# Patient Record
Sex: Male | Born: 1980 | Race: Black or African American | Hispanic: No
Health system: Southern US, Community
[De-identification: ages and names within clinical notes are randomized; demographics above are authoritative.]

## PROBLEM LIST (undated history)

## (undated) DIAGNOSIS — E119 Type 2 diabetes mellitus without complications: Secondary | ICD-10-CM

## (undated) DIAGNOSIS — E1143 Type 2 diabetes mellitus with diabetic autonomic (poly)neuropathy: Secondary | ICD-10-CM

## (undated) DIAGNOSIS — K3184 Gastroparesis: Secondary | ICD-10-CM

---

## 2012-01-25 ENCOUNTER — Emergency Department: Payer: Self-pay | Admitting: Emergency Medicine

## 2012-01-25 LAB — BASIC METABOLIC PANEL
BUN: 14 mg/dL (ref 7–18)
Chloride: 94 mmol/L — ABNORMAL LOW (ref 98–107)
Creatinine: 1.24 mg/dL (ref 0.60–1.30)
EGFR (African American): >60
EGFR (Non-African Amer.): >60
Glucose: 467 mg/dL — ABNORMAL HIGH (ref 65–99)
Osmolality: 284 (ref 275–301)
Potassium: 4.7 mmol/L (ref 3.5–5.1)
Sodium: 131 mmol/L — ABNORMAL LOW (ref 136–145)

## 2012-01-25 LAB — URINALYSIS, COMPLETE
Bacteria: NONE SEEN
Glucose,UR: >=500 mg/dL (ref 0–75)
Nitrite: NEGATIVE
RBC,UR: 1 /HPF (ref 0–5)
Specific Gravity: 1.026 (ref 1.003–1.030)

## 2012-01-25 LAB — CBC
HGB: 16.4 g/dL (ref 13.0–18.0)
MCH: 32.1 pg (ref 26.0–34.0)
MCHC: 33.4 g/dL (ref 32.0–36.0)
MCV: 96 fL (ref 80–100)
WBC: 7.5 10*3/uL (ref 3.8–10.6)

## 2012-03-04 ENCOUNTER — Emergency Department: Payer: Self-pay | Admitting: Emergency Medicine

## 2012-03-25 ENCOUNTER — Emergency Department: Payer: Self-pay | Admitting: Emergency Medicine

## 2012-03-25 LAB — LIPASE, BLOOD: Lipase: 71 U/L — ABNORMAL LOW (ref 73–393)

## 2012-03-25 LAB — CBC
HGB: 16.2 g/dL (ref 13.0–18.0)
RBC: 5.1 10*6/uL (ref 4.40–5.90)
WBC: 7 10*3/uL (ref 3.8–10.6)

## 2012-03-25 LAB — COMPREHENSIVE METABOLIC PANEL
Albumin: 4.3 g/dL (ref 3.4–5.0)
Alkaline Phosphatase: 90 U/L (ref 50–136)
BUN: 14 mg/dL (ref 7–18)
Calcium, Total: 9 mg/dL (ref 8.5–10.1)
Glucose: 208 mg/dL — ABNORMAL HIGH (ref 65–99)
Osmolality: 280 (ref 275–301)
SGOT(AST): 24 U/L (ref 15–37)
Sodium: 137 mmol/L (ref 136–145)
Total Protein: 8.1 g/dL (ref 6.4–8.2)

## 2012-03-25 LAB — URINALYSIS, COMPLETE
Bilirubin,UR: NEGATIVE
Blood: NEGATIVE
Nitrite: NEGATIVE
Ph: 7 (ref 4.5–8.0)
RBC,UR: 2 /HPF (ref 0–5)

## 2012-06-13 ENCOUNTER — Emergency Department: Payer: Self-pay | Admitting: Emergency Medicine

## 2012-06-13 LAB — URINALYSIS, COMPLETE
Blood: NEGATIVE
Glucose,UR: 50 mg/dL (ref 0–75)
Hyaline Cast: 1
Leukocyte Esterase: NEGATIVE
Nitrite: NEGATIVE
Protein: 30
Specific Gravity: 1.03 (ref 1.003–1.030)

## 2012-06-13 LAB — COMPREHENSIVE METABOLIC PANEL
Alkaline Phosphatase: 97 U/L (ref 50–136)
BUN: 11 mg/dL (ref 7–18)
Bilirubin,Total: 0.6 mg/dL (ref 0.2–1.0)
Calcium, Total: 9 mg/dL (ref 8.5–10.1)
Chloride: 104 mmol/L (ref 98–107)
Co2: 30 mmol/L (ref 21–32)
EGFR (Non-African Amer.): >60
SGOT(AST): 20 U/L (ref 15–37)
SGPT (ALT): 16 U/L (ref 12–78)

## 2012-06-13 LAB — CBC
MCH: 32.4 pg (ref 26.0–34.0)
MCHC: 34.5 g/dL (ref 32.0–36.0)
Platelet: 200 10*3/uL (ref 150–440)
RBC: 5.09 10*6/uL (ref 4.40–5.90)
RDW: 13.2 % (ref 11.5–14.5)

## 2012-12-06 ENCOUNTER — Emergency Department: Payer: Self-pay | Admitting: Unknown Physician Specialty

## 2012-12-06 LAB — URINALYSIS, COMPLETE
Bilirubin,UR: NEGATIVE
Blood: NEGATIVE
Leukocyte Esterase: NEGATIVE
Ph: 6 (ref 4.5–8.0)
Protein: NEGATIVE
Specific Gravity: 1.035 (ref 1.003–1.030)
WBC UR: <1 /HPF (ref 0–5)

## 2012-12-06 LAB — COMPREHENSIVE METABOLIC PANEL
Albumin: 3.6 g/dL (ref 3.4–5.0)
BUN: 12 mg/dL (ref 7–18)
Bilirubin,Total: 0.3 mg/dL (ref 0.2–1.0)
Calcium, Total: 8.8 mg/dL (ref 8.5–10.1)
Chloride: 99 mmol/L (ref 98–107)
Co2: 30 mmol/L (ref 21–32)
EGFR (African American): >60
EGFR (Non-African Amer.): >60
Glucose: 370 mg/dL — ABNORMAL HIGH (ref 65–99)

## 2012-12-06 LAB — CBC WITH DIFFERENTIAL/PLATELET
Basophil #: 0.1 10*3/uL (ref 0.0–0.1)
Eosinophil #: 0.2 10*3/uL (ref 0.0–0.7)
MCH: 31.3 pg (ref 26.0–34.0)
MCHC: 33.6 g/dL (ref 32.0–36.0)
MCV: 93 fL (ref 80–100)
Monocyte #: 0.5 x10 3/mm (ref 0.2–1.0)
Monocyte %: 5.8 %
Neutrophil %: 72.1 %
Platelet: 188 10*3/uL (ref 150–440)
RBC: 4.53 10*6/uL (ref 4.40–5.90)
WBC: 9.1 10*3/uL (ref 3.8–10.6)

## 2012-12-11 ENCOUNTER — Emergency Department: Payer: Self-pay | Admitting: Internal Medicine

## 2012-12-11 LAB — CBC
HGB: 15.4 g/dL (ref 13.0–18.0)
MCH: 31.6 pg (ref 26.0–34.0)
MCHC: 34.3 g/dL (ref 32.0–36.0)
MCV: 92 fL (ref 80–100)
Platelet: 208 10*3/uL (ref 150–440)
RBC: 4.87 10*6/uL (ref 4.40–5.90)

## 2012-12-11 LAB — COMPREHENSIVE METABOLIC PANEL
Anion Gap: 4 — ABNORMAL LOW (ref 7–16)
Bilirubin,Total: 0.6 mg/dL (ref 0.2–1.0)
Calcium, Total: 9.5 mg/dL (ref 8.5–10.1)
EGFR (African American): >60
EGFR (Non-African Amer.): >60
Glucose: 266 mg/dL — ABNORMAL HIGH (ref 65–99)
Osmolality: 275 (ref 275–301)
Potassium: 4.1 mmol/L (ref 3.5–5.1)
SGOT(AST): 14 U/L — ABNORMAL LOW (ref 15–37)
SGPT (ALT): 15 U/L (ref 12–78)

## 2012-12-11 LAB — URINALYSIS, COMPLETE
Bilirubin,UR: NEGATIVE
Ketone: NEGATIVE
Leukocyte Esterase: NEGATIVE
Nitrite: NEGATIVE
RBC,UR: NONE SEEN /HPF (ref 0–5)
Squamous Epithelial: NONE SEEN

## 2013-02-01 ENCOUNTER — Inpatient Hospital Stay: Payer: Self-pay | Admitting: Internal Medicine

## 2013-02-01 LAB — COMPREHENSIVE METABOLIC PANEL
Alkaline Phosphatase: 137 U/L — ABNORMAL HIGH (ref 50–136)
Anion Gap: 20 — ABNORMAL HIGH (ref 7–16)
BUN: 26 mg/dL — ABNORMAL HIGH (ref 7–18)
Bilirubin,Total: 0.9 mg/dL (ref 0.2–1.0)
Creatinine: 1.53 mg/dL — ABNORMAL HIGH (ref 0.60–1.30)
EGFR (Non-African Amer.): 60 — ABNORMAL LOW
Glucose: 525 mg/dL (ref 65–99)
Potassium: 5.2 mmol/L — ABNORMAL HIGH (ref 3.5–5.1)
SGPT (ALT): 23 U/L (ref 12–78)
Total Protein: 8.4 g/dL — ABNORMAL HIGH (ref 6.4–8.2)

## 2013-02-01 LAB — URINALYSIS, COMPLETE
Bilirubin,UR: NEGATIVE
Blood: NEGATIVE
Glucose,UR: >=500 mg/dL (ref 0–75)
Leukocyte Esterase: NEGATIVE
Protein: NEGATIVE
Squamous Epithelial: NONE SEEN
WBC UR: 1 /HPF (ref 0–5)

## 2013-02-01 LAB — BASIC METABOLIC PANEL
Anion Gap: 20 — ABNORMAL HIGH (ref 7–16)
Anion Gap: 13 (ref 7–16)
Calcium, Total: 8.9 mg/dL (ref 8.5–10.1)
Chloride: 98 mmol/L (ref 98–107)
Creatinine: 1.22 mg/dL (ref 0.60–1.30)
Creatinine: 1.3 mg/dL (ref 0.60–1.30)
EGFR (African American): >60
EGFR (Non-African Amer.): >60
Osmolality: 289 (ref 275–301)
Osmolality: 284 (ref 275–301)
Potassium: 4.5 mmol/L (ref 3.5–5.1)
Potassium: 3.9 mmol/L (ref 3.5–5.1)
Sodium: 134 mmol/L — ABNORMAL LOW (ref 136–145)

## 2013-02-01 LAB — CBC
MCH: 31.4 pg (ref 26.0–34.0)
MCV: 94 fL (ref 80–100)
RBC: 4.88 10*6/uL (ref 4.40–5.90)

## 2013-02-01 LAB — MAGNESIUM: Magnesium: 1.7 mg/dL — ABNORMAL LOW

## 2013-02-01 LAB — HEMOGLOBIN A1C: Hemoglobin A1C: 8.8 % — ABNORMAL HIGH (ref 4.2–6.3)

## 2013-02-02 LAB — CBC WITH DIFFERENTIAL/PLATELET
Basophil #: 0.1 10*3/uL (ref 0.0–0.1)
Lymphocyte %: 19.3 %
MCHC: 35.1 g/dL (ref 32.0–36.0)
Monocyte %: 6.6 %
Neutrophil %: 73 %
Platelet: 187 10*3/uL (ref 150–440)
RBC: 4.18 10*6/uL — ABNORMAL LOW (ref 4.40–5.90)
RDW: 13.2 % (ref 11.5–14.5)

## 2013-02-02 LAB — LIPID PANEL
Cholesterol: 106 mg/dL (ref 0–200)
HDL Cholesterol: 34 mg/dL — ABNORMAL LOW (ref 40–60)
Triglycerides: 38 mg/dL (ref 0–200)

## 2013-02-02 LAB — BASIC METABOLIC PANEL
Anion Gap: 8 (ref 7–16)
Chloride: 108 mmol/L — ABNORMAL HIGH (ref 98–107)
EGFR (Non-African Amer.): >60
Glucose: 80 mg/dL (ref 65–99)
Osmolality: 284 (ref 275–301)
Potassium: 3.7 mmol/L (ref 3.5–5.1)
Sodium: 142 mmol/L (ref 136–145)

## 2013-02-11 ENCOUNTER — Encounter (HOSPITAL_COMMUNITY): Payer: Self-pay | Admitting: *Deleted

## 2013-02-11 ENCOUNTER — Emergency Department (HOSPITAL_COMMUNITY)
Admission: EM | Admit: 2013-02-11 | Discharge: 2013-02-11 | Payer: PRIVATE HEALTH INSURANCE | Attending: Emergency Medicine | Admitting: Emergency Medicine

## 2013-02-11 ENCOUNTER — Emergency Department (HOSPITAL_COMMUNITY): Payer: PRIVATE HEALTH INSURANCE

## 2013-02-11 DIAGNOSIS — K3184 Gastroparesis: Secondary | ICD-10-CM | POA: Insufficient documentation

## 2013-02-11 DIAGNOSIS — K2970 Gastritis, unspecified, without bleeding: Secondary | ICD-10-CM

## 2013-02-11 DIAGNOSIS — E1149 Type 2 diabetes mellitus with other diabetic neurological complication: Secondary | ICD-10-CM | POA: Insufficient documentation

## 2013-02-11 DIAGNOSIS — R109 Unspecified abdominal pain: Secondary | ICD-10-CM | POA: Insufficient documentation

## 2013-02-11 DIAGNOSIS — E1169 Type 2 diabetes mellitus with other specified complication: Secondary | ICD-10-CM | POA: Insufficient documentation

## 2013-02-11 DIAGNOSIS — E119 Type 2 diabetes mellitus without complications: Secondary | ICD-10-CM

## 2013-02-11 HISTORY — DX: Gastroparesis: K31.84

## 2013-02-11 HISTORY — DX: Type 2 diabetes mellitus without complications: E11.9

## 2013-02-11 HISTORY — DX: Type 2 diabetes mellitus with diabetic autonomic (poly)neuropathy: E11.43

## 2013-02-11 LAB — CBC WITH DIFFERENTIAL/PLATELET
Basophils Absolute: 0 10*3/uL (ref 0.0–0.1)
Eosinophils Relative: 2 % (ref 0–5)
HCT: 43.7 % (ref 39.0–52.0)
Lymphocytes Relative: 23 % (ref 12–46)
Lymphs Abs: 1.8 10*3/uL (ref 0.7–4.0)
MCH: 32.2 pg (ref 26.0–34.0)
MCV: 91.2 fL (ref 78.0–100.0)
Monocytes Absolute: 0.5 10*3/uL (ref 0.1–1.0)
RDW: 12.1 % (ref 11.5–15.5)
WBC: 7.9 10*3/uL (ref 4.0–10.5)

## 2013-02-11 LAB — COMPREHENSIVE METABOLIC PANEL
CO2: 33 mEq/L — ABNORMAL HIGH (ref 19–32)
Calcium: 10.1 mg/dL (ref 8.4–10.5)
Creatinine, Ser: 0.83 mg/dL (ref 0.50–1.35)
GFR calc Af Amer: >90 mL/min (ref >90)
GFR calc non Af Amer: >90 mL/min (ref >90)
Glucose, Bld: 465 mg/dL — ABNORMAL HIGH (ref 70–99)

## 2013-02-11 MED ORDER — ONDANSETRON HCL 4 MG/2ML IJ SOLN
4.0000 mg | Freq: Once | INTRAMUSCULAR | Status: AC
  Administered 2013-02-11: 4 mg via INTRAVENOUS
  Filled 2013-02-11: qty 2

## 2013-02-11 MED ORDER — INSULIN ASPART 100 UNIT/ML ~~LOC~~ SOLN
15.0000 [IU] | Freq: Once | SUBCUTANEOUS | Status: AC
  Administered 2013-02-11: 15 [IU] via SUBCUTANEOUS

## 2013-02-11 MED ORDER — SODIUM CHLORIDE 0.9 % IV BOLUS (SEPSIS)
1000.0000 mL | Freq: Once | INTRAVENOUS | Status: AC
  Administered 2013-02-11: 1000 mL via INTRAVENOUS

## 2013-02-11 MED ORDER — PANTOPRAZOLE SODIUM 40 MG IV SOLR
40.0000 mg | Freq: Once | INTRAVENOUS | Status: AC
  Administered 2013-02-11: 40 mg via INTRAVENOUS
  Filled 2013-02-11: qty 40

## 2013-02-11 MED ORDER — MORPHINE SULFATE 4 MG/ML IJ SOLN
4.0000 mg | Freq: Once | INTRAMUSCULAR | Status: AC
  Administered 2013-02-11: 4 mg via INTRAVENOUS
  Filled 2013-02-11: qty 1

## 2013-02-11 MED ORDER — RANITIDINE HCL 150 MG PO CAPS: 150.0000 mg | ORAL_CAPSULE | Freq: Two times a day (BID) | ORAL | Status: AC

## 2013-02-11 NOTE — ED Notes (Signed)
Pt refusing DG ABD Acute W/Chest. States "I don't think I need that". Explained to pt purpose of the x-ray. Pt verbalized understanding and refused. EDP notified.

## 2013-02-11 NOTE — ED Notes (Signed)
CBG 499 in triage

## 2013-02-11 NOTE — ED Notes (Signed)
Pt with hyperglycemia x 4-5 days, abd pain, denies vomiting, EMS has checked pt's CBG and readings over 500 and 600, pt has hx of DKA

## 2013-02-11 NOTE — ED Provider Notes (Signed)
   History  This chart was scribed for Christopher Lennert, MD, by Candelaria Stagers, ED Scribe. This patient was seen in room APA04/APA04 and the patient's care was started at 8:35 PM  CSN: [id number] Arrival date & time 02/11/13  2013  First MD Initiated Contact with Patient 02/11/13 2032     Chief Complaint  Patient presents with  . Hyperglycemia  . Abdominal Pain    Patient is a 32 y.o. male presenting with hyperglycemia and abdominal pain. The history is provided by the patient. No language interpreter was used.  Hyperglycemia Blood sugar level PTA:  500 Severity:  Moderate Onset quality:  Unable to specify Timing:  Constant Progression:  Unchanged Chronicity:  Recurrent Diabetes status:  Controlled with insulin Context: not change in medication   Relieved by:  Nothing Associated symptoms: abdominal pain   Abdominal Pain Associated symptoms include abdominal pain.   HPI Comments: Christopher Bean is a 32 y.o. male with h/o diabetes who presents to the Emergency Department complaining of hyperglycemia over the last 4 days with sugar levels of 500-600 today.  He reports he is also experiencing abdominal pain.  Pt has h/o DKA.  He has continued to take insulin with no improvement.  Pt is currently incarcerated.   Past Medical History  Diagnosis Date  . Diabetes mellitus without complication   . Gastroparesis due to DM    History reviewed. No pertinent past surgical history. History reviewed. No pertinent family history. History  Substance Use Topics  . Smoking status: Not on file  . Smokeless tobacco: Not on file  . Alcohol Use: No    Review of Systems  Constitutional:       Hyperglycemia.   Gastrointestinal: Positive for abdominal pain.  All other systems reviewed and are negative.    Allergies  Review of patient's allergies indicates no known allergies.  Home Medications  No current outpatient prescriptions on file. BP 134/99  Pulse 82  Temp(Src) 98.2 F (36.8  C) (Oral)  Resp 20  Ht 6\' 5"  (1.956 m)  Wt 177 lb (80.287 kg)  BMI 20.98 kg/m2  SpO2 100% Physical Exam  Nursing note and vitals reviewed. Constitutional: He is oriented to person, place, and time. He appears well-developed and well-nourished. No distress.  HENT:  Head: Normocephalic and atraumatic.  Eyes: EOM are normal.  Neck: Neck supple. No tracheal deviation present.  Cardiovascular: Normal rate.   Pulmonary/Chest: Effort normal. No respiratory distress.  Abdominal: There is tenderness (moderate abdominal tenderness).  Musculoskeletal: Normal range of motion.  Neurological: He is alert and oriented to person, place, and time.  Skin: Skin is warm and dry.  Psychiatric: He has a normal mood and affect. His behavior is normal.    ED Course  Procedures   DIAGNOSTIC STUDIES: Oxygen Saturation is 100% on room air, normal by my interpretation.    COORDINATION OF CARE:  8:37 PM Discussed course of care with pt who understands and agrees.   Labs Reviewed  GLUCOSE, CAPILLARY - Abnormal; Notable for the following:    Glucose-Capillary 499 (*)    All other components within normal limits   No results found. No diagnosis found.  MDM   The chart was scribed for me under my direct supervision.  I personally performed the history, physical, and medical decision making and all procedures in the evaluation of this patient.Christopher Lennert, MD 02/12/13 [phone]

## 2013-03-09 ENCOUNTER — Emergency Department: Payer: Self-pay | Admitting: Internal Medicine

## 2013-03-09 LAB — URINALYSIS, COMPLETE
Bacteria: NONE SEEN
Bilirubin,UR: NEGATIVE
Glucose,UR: NEGATIVE mg/dL (ref 0–75)
Leukocyte Esterase: NEGATIVE
Nitrite: NEGATIVE
Ph: 8 (ref 4.5–8.0)
Protein: NEGATIVE
RBC,UR: 1 /HPF (ref 0–5)
WBC UR: <1 /HPF (ref 0–5)

## 2013-03-09 LAB — COMPREHENSIVE METABOLIC PANEL
Anion Gap: 5 — ABNORMAL LOW (ref 7–16)
Bilirubin,Total: 0.5 mg/dL (ref 0.2–1.0)
Chloride: 99 mmol/L (ref 98–107)
Creatinine: 0.84 mg/dL (ref 0.60–1.30)
Potassium: 4.5 mmol/L (ref 3.5–5.1)
SGOT(AST): 24 U/L (ref 15–37)
Sodium: 134 mmol/L — ABNORMAL LOW (ref 136–145)

## 2013-03-09 LAB — CBC: HCT: 44.6 % (ref 40.0–52.0)

## 2013-03-09 LAB — TROPONIN I: Troponin-I: <0.02 ng/mL

## 2013-03-14 LAB — CULTURE, BLOOD (SINGLE)

## 2013-04-03 ENCOUNTER — Emergency Department: Payer: Self-pay | Admitting: Emergency Medicine

## 2013-04-03 LAB — URINALYSIS, COMPLETE
Bacteria: NONE SEEN
Bilirubin,UR: NEGATIVE
Glucose,UR: >=500 mg/dL (ref 0–75)
Leukocyte Esterase: NEGATIVE
Nitrite: NEGATIVE
Protein: NEGATIVE
Specific Gravity: 1.036 (ref 1.003–1.030)
Squamous Epithelial: <1
WBC UR: 1 /HPF (ref 0–5)

## 2013-04-03 LAB — CBC
HCT: 44.1 % (ref 40.0–52.0)
HGB: 15.4 g/dL (ref 13.0–18.0)
MCHC: 35 g/dL (ref 32.0–36.0)
Platelet: 195 10*3/uL (ref 150–440)
RBC: 4.78 10*6/uL (ref 4.40–5.90)
WBC: 9.5 10*3/uL (ref 3.8–10.6)

## 2013-04-03 LAB — COMPREHENSIVE METABOLIC PANEL
Albumin: 3.8 g/dL (ref 3.4–5.0)
Alkaline Phosphatase: 154 U/L — ABNORMAL HIGH (ref 50–136)
Chloride: 97 mmol/L — ABNORMAL LOW (ref 98–107)
Creatinine: 1.23 mg/dL (ref 0.60–1.30)
EGFR (African American): >60
Potassium: 4.3 mmol/L (ref 3.5–5.1)
SGOT(AST): 14 U/L — ABNORMAL LOW (ref 15–37)
SGPT (ALT): 19 U/L (ref 12–78)
Sodium: 131 mmol/L — ABNORMAL LOW (ref 136–145)
Total Protein: 7.8 g/dL (ref 6.4–8.2)

## 2013-04-03 LAB — LIPASE, BLOOD: Lipase: 90 U/L (ref 73–393)

## 2013-04-27 ENCOUNTER — Emergency Department: Payer: Self-pay | Admitting: Emergency Medicine

## 2013-04-27 LAB — URINALYSIS, COMPLETE
Bacteria: NONE SEEN
Bilirubin,UR: NEGATIVE
Blood: NEGATIVE
Glucose,UR: >=500 mg/dL (ref 0–75)
Leukocyte Esterase: NEGATIVE
Nitrite: NEGATIVE
Protein: NEGATIVE
RBC,UR: 2 /HPF (ref 0–5)
Specific Gravity: 1.028 (ref 1.003–1.030)
WBC UR: 1 /HPF (ref 0–5)

## 2013-04-27 LAB — COMPREHENSIVE METABOLIC PANEL
Anion Gap: 1 — ABNORMAL LOW (ref 7–16)
Bilirubin,Total: 0.3 mg/dL (ref 0.2–1.0)
Calcium, Total: 9.1 mg/dL (ref 8.5–10.1)
Creatinine: 1.09 mg/dL (ref 0.60–1.30)
EGFR (Non-African Amer.): >60
Glucose: 340 mg/dL — ABNORMAL HIGH (ref 65–99)
Osmolality: 282 (ref 275–301)
Potassium: 4.3 mmol/L (ref 3.5–5.1)
SGOT(AST): 12 U/L — ABNORMAL LOW (ref 15–37)
SGPT (ALT): 17 U/L (ref 12–78)
Total Protein: 7.5 g/dL (ref 6.4–8.2)

## 2013-04-27 LAB — CBC
HCT: 44.8 % (ref 40.0–52.0)
MCH: 32 pg (ref 26.0–34.0)
MCHC: 34.6 g/dL (ref 32.0–36.0)
MCV: 93 fL (ref 80–100)
Platelet: 191 10*3/uL (ref 150–440)
RBC: 4.84 10*6/uL (ref 4.40–5.90)
RDW: 12.6 % (ref 11.5–14.5)

## 2013-05-30 ENCOUNTER — Emergency Department: Payer: Self-pay | Admitting: Emergency Medicine

## 2013-05-30 LAB — BASIC METABOLIC PANEL
Anion Gap: 2 — ABNORMAL LOW (ref 7–16)
Calcium, Total: 9.1 mg/dL (ref 8.5–10.1)
Creatinine: 1.19 mg/dL (ref 0.60–1.30)
Osmolality: 283 (ref 275–301)

## 2013-05-30 LAB — URINALYSIS, COMPLETE
Glucose,UR: >=500 mg/dL (ref 0–75)
Leukocyte Esterase: NEGATIVE
Nitrite: NEGATIVE
Specific Gravity: 1.031 (ref 1.003–1.030)
Squamous Epithelial: NONE SEEN
WBC UR: 1 /HPF (ref 0–5)

## 2013-05-30 LAB — CBC
HGB: 15.4 g/dL (ref 13.0–18.0)
MCH: 31 pg (ref 26.0–34.0)
MCV: 93 fL (ref 80–100)
Platelet: 183 10*3/uL (ref 150–440)
RBC: 4.96 10*6/uL (ref 4.40–5.90)
RDW: 13.1 % (ref 11.5–14.5)
WBC: 7.7 10*3/uL (ref 3.8–10.6)

## 2013-06-16 ENCOUNTER — Emergency Department: Payer: Self-pay | Admitting: Emergency Medicine

## 2013-06-16 LAB — CBC
HCT: 40.4 % (ref 40.0–52.0)
HGB: 13.6 g/dL (ref 13.0–18.0)
MCH: 31.6 pg (ref 26.0–34.0)
MCHC: 33.7 g/dL (ref 32.0–36.0)
Platelet: 122 10*3/uL — ABNORMAL LOW (ref 150–440)
WBC: 6.1 10*3/uL (ref 3.8–10.6)

## 2013-06-16 LAB — BASIC METABOLIC PANEL
BUN: 20 mg/dL — ABNORMAL HIGH (ref 7–18)
Calcium, Total: 8.5 mg/dL (ref 8.5–10.1)
Co2: 27 mmol/L (ref 21–32)
Creatinine: 1.22 mg/dL (ref 0.60–1.30)
EGFR (African American): >60
Glucose: 625 mg/dL (ref 65–99)
Potassium: 4.1 mmol/L (ref 3.5–5.1)
Sodium: 128 mmol/L — ABNORMAL LOW (ref 136–145)

## 2013-06-16 LAB — URINALYSIS, COMPLETE
Leukocyte Esterase: NEGATIVE
Nitrite: NEGATIVE
Ph: 6 (ref 4.5–8.0)
Protein: NEGATIVE
Specific Gravity: 1.026 (ref 1.003–1.030)
Squamous Epithelial: <1
WBC UR: NONE SEEN /HPF (ref 0–5)

## 2013-06-16 LAB — BETA-HYDROXYBUTYRIC ACID: Beta-Hydroxybutyrate: 5.74 mg/dL — ABNORMAL HIGH (ref 0.2–2.8)

## 2013-10-25 ENCOUNTER — Emergency Department: Payer: Self-pay | Admitting: Emergency Medicine

## 2013-10-25 LAB — URINALYSIS, COMPLETE
Bacteria: NONE SEEN
Bilirubin,UR: NEGATIVE
Blood: NEGATIVE
Glucose,UR: >=500 mg/dL (ref 0–75)
Ketone: NEGATIVE
Leukocyte Esterase: NEGATIVE
Nitrite: NEGATIVE
Ph: 6 (ref 4.5–8.0)
Protein: NEGATIVE
RBC,UR: NONE SEEN /HPF (ref 0–5)
Specific Gravity: 1.025 (ref 1.003–1.030)
Squamous Epithelial: NONE SEEN
WBC UR: <1 /HPF (ref 0–5)

## 2013-10-25 LAB — CBC WITH DIFFERENTIAL/PLATELET
Basophil #: 0.1 10*3/uL (ref 0.0–0.1)
Basophil %: 0.8 %
EOS PCT: 4.9 %
Eosinophil #: 0.3 10*3/uL (ref 0.0–0.7)
HCT: 47.9 % (ref 40.0–52.0)
HGB: 16 g/dL (ref 13.0–18.0)
LYMPHS ABS: 1.7 10*3/uL (ref 1.0–3.6)
Lymphocyte %: 25 %
MCH: 31.8 pg (ref 26.0–34.0)
MCHC: 33.4 g/dL (ref 32.0–36.0)
MCV: 95 fL (ref 80–100)
Monocyte #: 0.6 x10 3/mm (ref 0.2–1.0)
Monocyte %: 9.1 %
NEUTROS ABS: 4.1 10*3/uL (ref 1.4–6.5)
Neutrophil %: 60.2 %
PLATELETS: 182 10*3/uL (ref 150–440)
RBC: 5.04 10*6/uL (ref 4.40–5.90)
RDW: 13.2 % (ref 11.5–14.5)
WBC: 6.7 10*3/uL (ref 3.8–10.6)

## 2013-10-25 LAB — COMPREHENSIVE METABOLIC PANEL
ALT: 21 U/L (ref 12–78)
Albumin: 3.8 g/dL (ref 3.4–5.0)
Alkaline Phosphatase: 96 U/L
Anion Gap: 2 — ABNORMAL LOW (ref 7–16)
BUN: 13 mg/dL (ref 7–18)
Bilirubin,Total: 0.3 mg/dL (ref 0.2–1.0)
CO2: 32 mmol/L (ref 21–32)
Calcium, Total: 8.9 mg/dL (ref 8.5–10.1)
Chloride: 99 mmol/L (ref 98–107)
Creatinine: 1.36 mg/dL — ABNORMAL HIGH (ref 0.60–1.30)
EGFR (Non-African Amer.): >60
Glucose: 447 mg/dL — ABNORMAL HIGH (ref 65–99)
Osmolality: 286 (ref 275–301)
Potassium: 4.3 mmol/L (ref 3.5–5.1)
SGOT(AST): 9 U/L — ABNORMAL LOW (ref 15–37)
SODIUM: 133 mmol/L — AB (ref 136–145)
Total Protein: 7.5 g/dL (ref 6.4–8.2)

## 2013-10-28 ENCOUNTER — Emergency Department: Payer: Self-pay | Admitting: Emergency Medicine

## 2013-10-28 LAB — BETA-HYDROXYBUTYRIC ACID: Beta-Hydroxybutyrate: 35.4 mg/dL — ABNORMAL HIGH (ref 0.2–2.8)

## 2013-10-28 LAB — COMPREHENSIVE METABOLIC PANEL
ALBUMIN: 4.2 g/dL (ref 3.4–5.0)
ALK PHOS: 89 U/L
ALT: 26 U/L (ref 12–78)
ANION GAP: 11 (ref 7–16)
AST: 31 U/L (ref 15–37)
BUN: 21 mg/dL — AB (ref 7–18)
Bilirubin,Total: 1 mg/dL (ref 0.2–1.0)
CO2: 26 mmol/L (ref 21–32)
Calcium, Total: 9.4 mg/dL (ref 8.5–10.1)
Chloride: 93 mmol/L — ABNORMAL LOW (ref 98–107)
Creatinine: 1.22 mg/dL (ref 0.60–1.30)
EGFR (Non-African Amer.): >60
Glucose: 419 mg/dL — ABNORMAL HIGH (ref 65–99)
Osmolality: 282 (ref 275–301)
Potassium: 4.9 mmol/L (ref 3.5–5.1)
Sodium: 130 mmol/L — ABNORMAL LOW (ref 136–145)
TOTAL PROTEIN: 7.7 g/dL (ref 6.4–8.2)

## 2013-10-28 LAB — LIPASE, BLOOD: Lipase: 90 U/L (ref 73–393)

## 2013-10-28 LAB — CBC WITH DIFFERENTIAL/PLATELET
BASOS PCT: 0.5 %
Basophil #: 0.1 10*3/uL (ref 0.0–0.1)
EOS PCT: 0.4 %
Eosinophil #: 0 10*3/uL (ref 0.0–0.7)
HCT: 42.7 % (ref 40.0–52.0)
HGB: 14.1 g/dL (ref 13.0–18.0)
LYMPHS ABS: 1.3 10*3/uL (ref 1.0–3.6)
Lymphocyte %: 11.9 %
MCH: 31.3 pg (ref 26.0–34.0)
MCHC: 33 g/dL (ref 32.0–36.0)
MCV: 95 fL (ref 80–100)
Monocyte #: 0.5 x10 3/mm (ref 0.2–1.0)
Monocyte %: 4.6 %
NEUTROS PCT: 82.6 %
Neutrophil #: 8.8 10*3/uL — ABNORMAL HIGH (ref 1.4–6.5)
Platelet: 193 10*3/uL (ref 150–440)
RBC: 4.5 10*6/uL (ref 4.40–5.90)
RDW: 12.9 % (ref 11.5–14.5)
WBC: 10.7 10*3/uL — ABNORMAL HIGH (ref 3.8–10.6)

## 2013-11-11 ENCOUNTER — Observation Stay: Payer: Self-pay | Admitting: Internal Medicine

## 2013-11-11 LAB — COMPREHENSIVE METABOLIC PANEL
ALK PHOS: 100 U/L
ALT: 46 U/L (ref 12–78)
Albumin: 3.5 g/dL (ref 3.4–5.0)
Anion Gap: 6 — ABNORMAL LOW (ref 7–16)
BUN: 12 mg/dL (ref 7–18)
Bilirubin,Total: 0.6 mg/dL (ref 0.2–1.0)
CO2: 28 mmol/L (ref 21–32)
CREATININE: 1.35 mg/dL — AB (ref 0.60–1.30)
Calcium, Total: 9 mg/dL (ref 8.5–10.1)
Chloride: 94 mmol/L — ABNORMAL LOW (ref 98–107)
EGFR (African American): >60
EGFR (Non-African Amer.): >60
GLUCOSE: 576 mg/dL — AB (ref 65–99)
OSMOLALITY: 283 (ref 275–301)
POTASSIUM: 5.1 mmol/L (ref 3.5–5.1)
SGOT(AST): 17 U/L (ref 15–37)
SODIUM: 128 mmol/L — AB (ref 136–145)
Total Protein: 7 g/dL (ref 6.4–8.2)

## 2013-11-11 LAB — BASIC METABOLIC PANEL
Anion Gap: 4 — ABNORMAL LOW (ref 7–16)
BUN: 12 mg/dL (ref 7–18)
CALCIUM: 9.3 mg/dL (ref 8.5–10.1)
CHLORIDE: 100 mmol/L (ref 98–107)
CREATININE: 1.22 mg/dL (ref 0.60–1.30)
Co2: 32 mmol/L (ref 21–32)
EGFR (African American): >60
Glucose: 288 mg/dL — ABNORMAL HIGH (ref 65–99)
OSMOLALITY: 282 (ref 275–301)
POTASSIUM: 3.8 mmol/L (ref 3.5–5.1)
Sodium: 136 mmol/L (ref 136–145)

## 2013-11-11 LAB — CBC
HCT: 44 % (ref 40.0–52.0)
HCT: 45.3 % (ref 40.0–52.0)
HGB: 14.2 g/dL (ref 13.0–18.0)
HGB: 14.8 g/dL (ref 13.0–18.0)
MCH: 30.6 pg (ref 26.0–34.0)
MCH: 30.6 pg (ref 26.0–34.0)
MCHC: 32.2 g/dL (ref 32.0–36.0)
MCHC: 32.7 g/dL (ref 32.0–36.0)
MCV: 95 fL (ref 80–100)
MCV: 94 fL (ref 80–100)
PLATELETS: 187 10*3/uL (ref 150–440)
Platelet: 184 10*3/uL (ref 150–440)
RBC: 4.62 10*6/uL (ref 4.40–5.90)
RBC: 4.84 10*6/uL (ref 4.40–5.90)
RDW: 13.5 % (ref 11.5–14.5)
RDW: 13.2 % (ref 11.5–14.5)
WBC: 7 10*3/uL (ref 3.8–10.6)
WBC: 7.5 10*3/uL (ref 3.8–10.6)

## 2013-11-11 LAB — LIPASE, BLOOD: Lipase: 83 U/L (ref 73–393)

## 2013-11-12 LAB — CBC WITH DIFFERENTIAL/PLATELET
Basophil #: 0.1 10*3/uL (ref 0.0–0.1)
Basophil %: 0.7 %
Eosinophil #: 0.4 10*3/uL (ref 0.0–0.7)
Eosinophil %: 5.4 %
HCT: 44.6 % (ref 40.0–52.0)
HGB: 14.6 g/dL (ref 13.0–18.0)
Lymphocyte #: 1.8 10*3/uL (ref 1.0–3.6)
Lymphocyte %: 26.5 %
MCH: 30.7 pg (ref 26.0–34.0)
MCHC: 32.7 g/dL (ref 32.0–36.0)
MCV: 94 fL (ref 80–100)
MONO ABS: 0.6 x10 3/mm (ref 0.2–1.0)
MONOS PCT: 8.9 %
Neutrophil #: 4 10*3/uL (ref 1.4–6.5)
Neutrophil %: 58.5 %
PLATELETS: 190 10*3/uL (ref 150–440)
RBC: 4.74 10*6/uL (ref 4.40–5.90)
RDW: 13.3 % (ref 11.5–14.5)
WBC: 6.8 10*3/uL (ref 3.8–10.6)

## 2013-11-12 LAB — HEMOGLOBIN A1C: HEMOGLOBIN A1C: 8.4 % — AB (ref 4.2–6.3)

## 2013-11-19 IMAGING — CR DG LUMBAR SPINE 2-3V
1 series · 3 of 3 positions shown · non-contrast
Comparison: none

REASON FOR EXAM: fall - landed on "lower back" - back pain  (please do
cross table for lateral an
COMMENTS:

PROCEDURE:     DXR - DXR LUMBAR SPINE AP AND LATERAL  - May 30, 2013 [DATE]
RESULT:     Five non-rib bearing lumbar vertebral bodies are appreciated.
There is no evidence of fracture, dislocation or malalignment.

[Series 1: ap · 0.17mm/px · 3 of 3 slices shown]
[im 1/3]
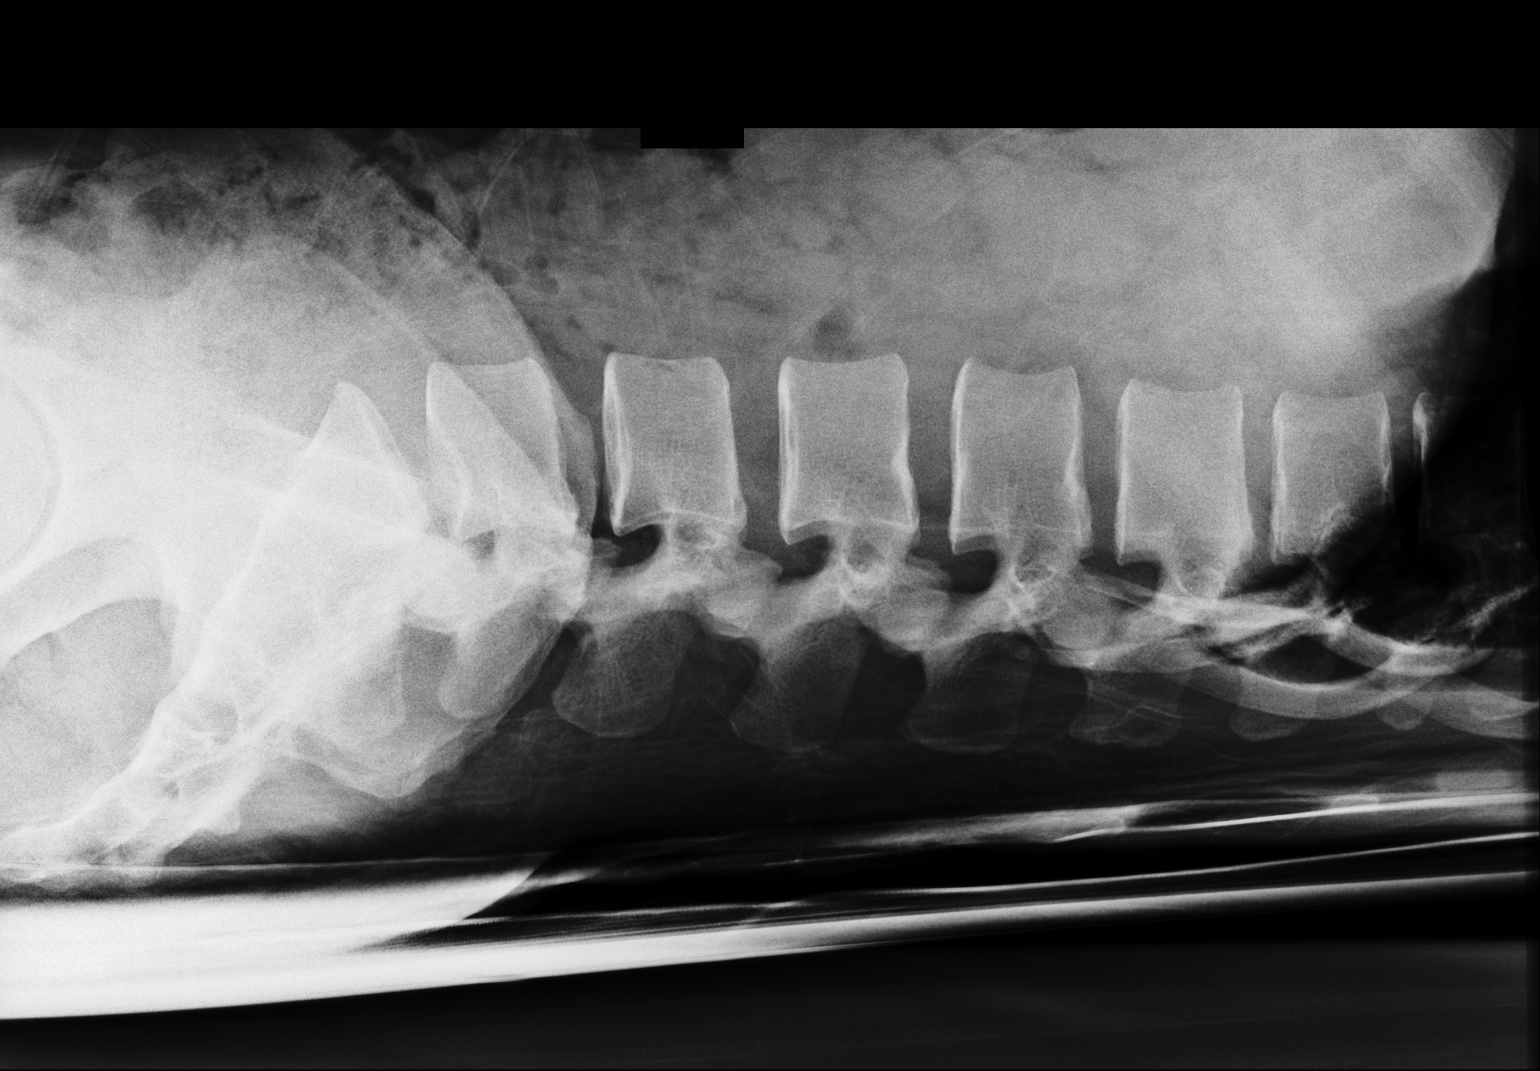
[im 2/3]
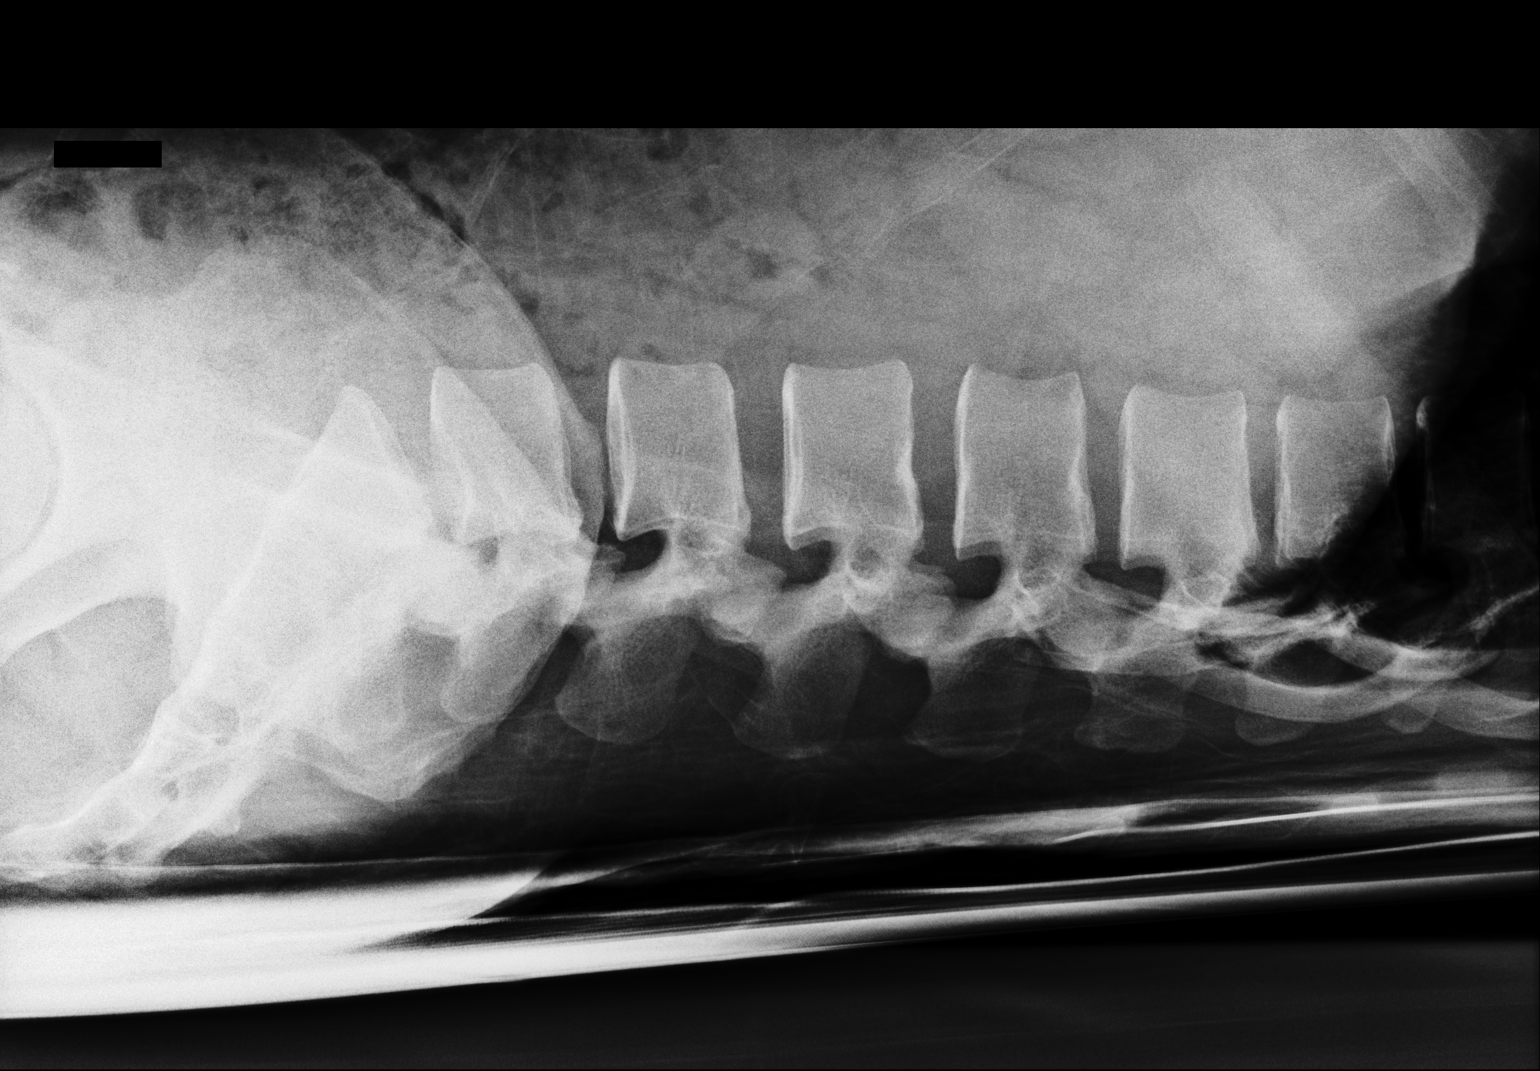
[im 3/3]
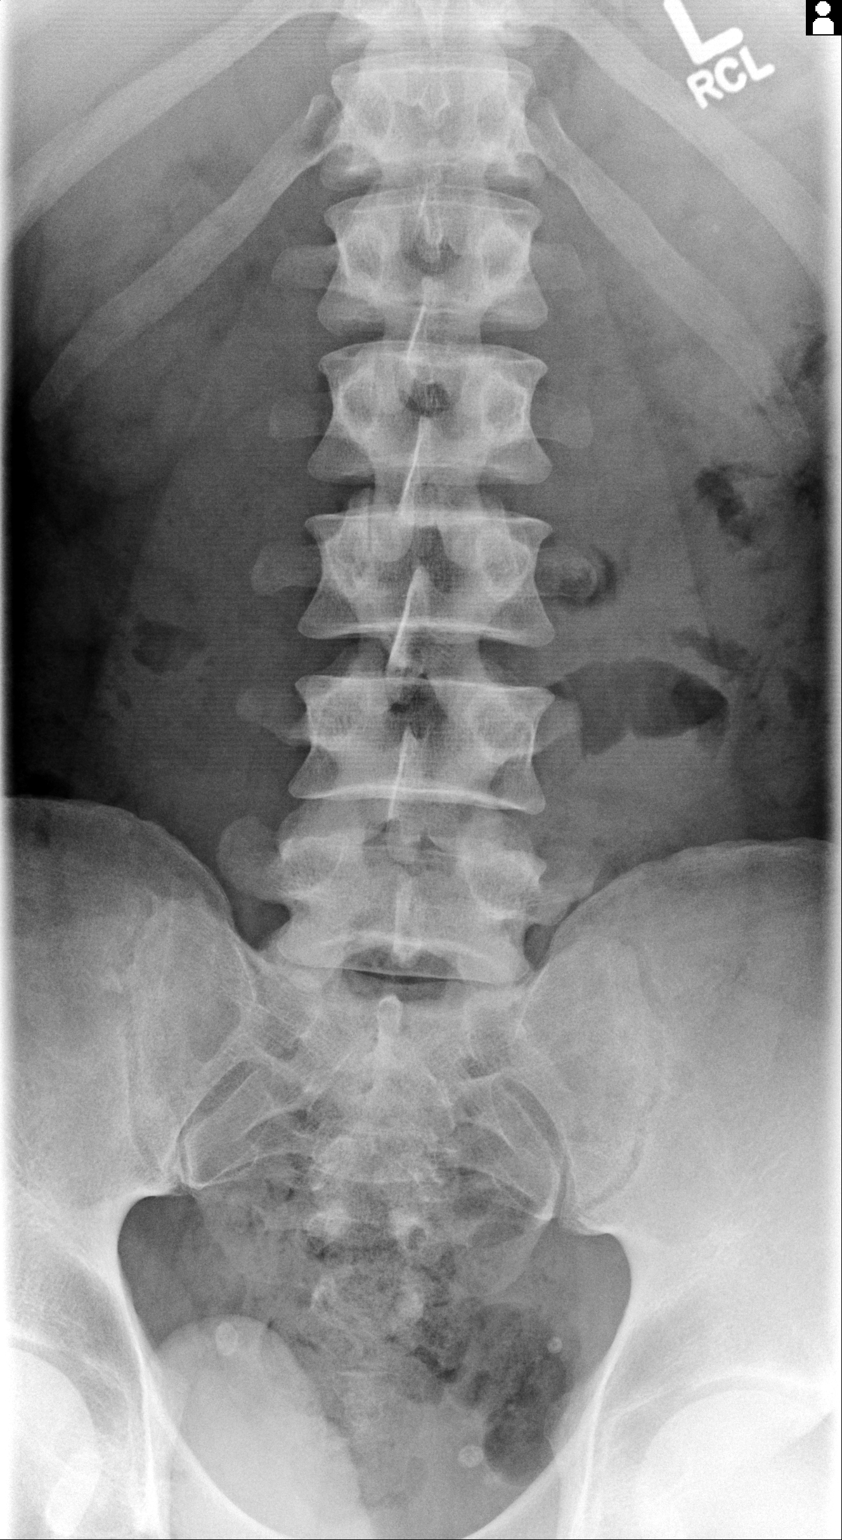

[3 of 3 positions shown; findings below may reference images not displayed]

IMPRESSION: 1. No acute osseous abnormalities.
2. If there is persistent clinical concern or persistent complaints of pain,
further evaluation with MRI is recommended.

## 2013-11-19 IMAGING — CT CT THORACIC SPINE WITHOUT CONTRAST
1 series · 12 of 14 positions shown, 15 images · non-contrast
Comparison: none

REASON FOR EXAM: fall off ladder - pain in the back - CT T7 to L1
COMMENTS:

[Series 3: axials · axial · 0.32mm/px · z∈[-982,-748]mm · 12 of 94 slices shown, 15 images]
[im 8/94  soft-tissue]
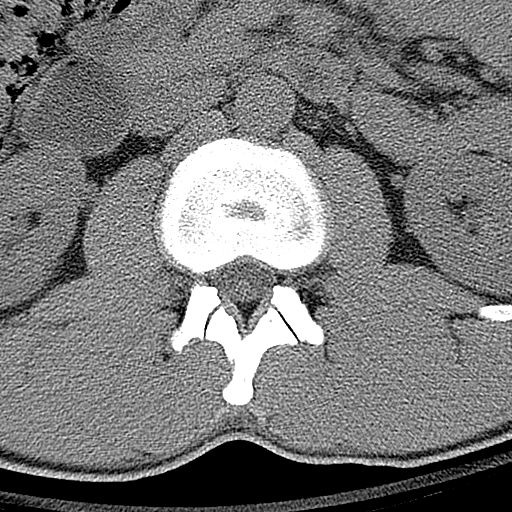
[im 8/94  bone]
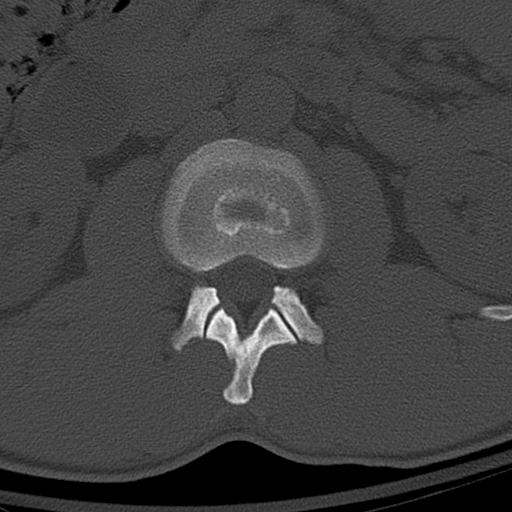
[im 15/94  bone]
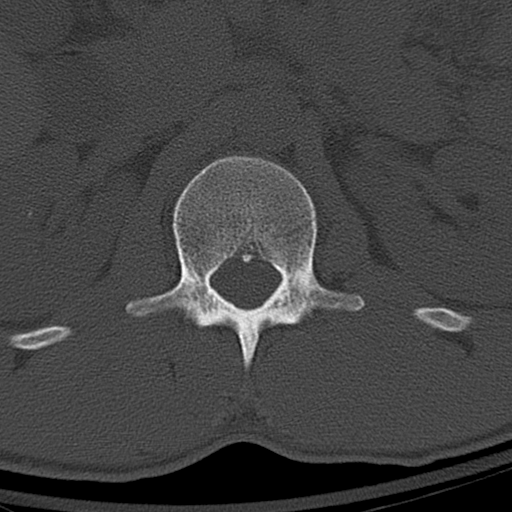
[im 22/94  bone]
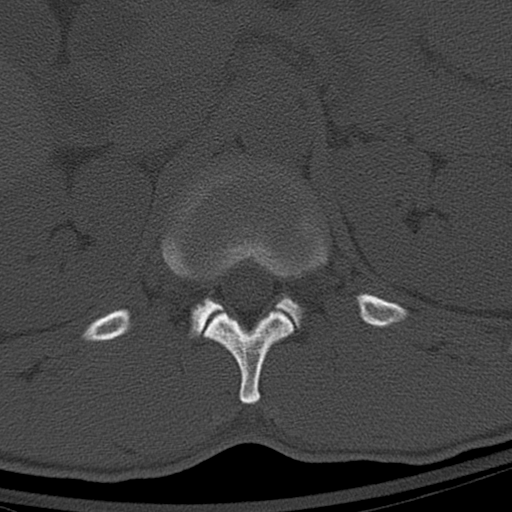
[im 29/94  bone]
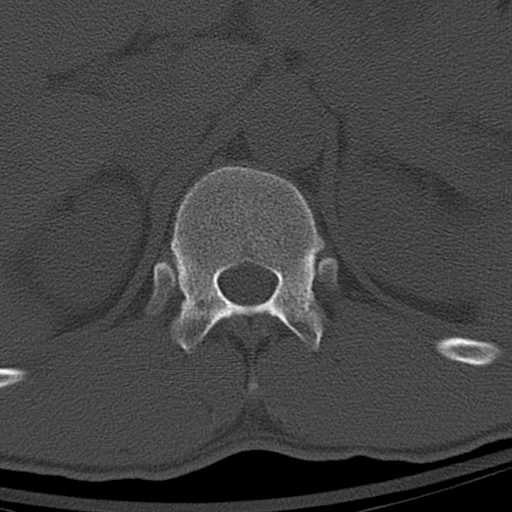
[im 36/94  soft-tissue]
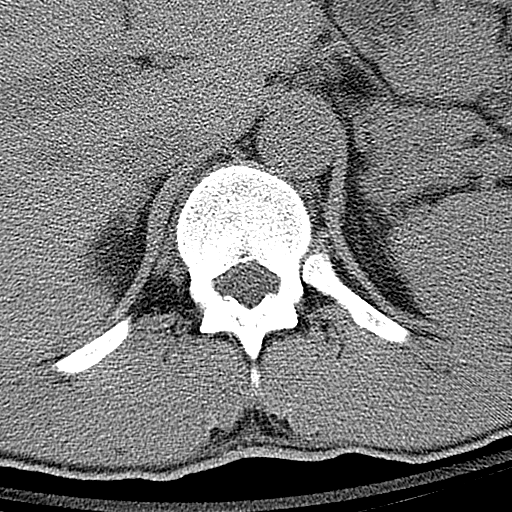
[im 36/94  bone]
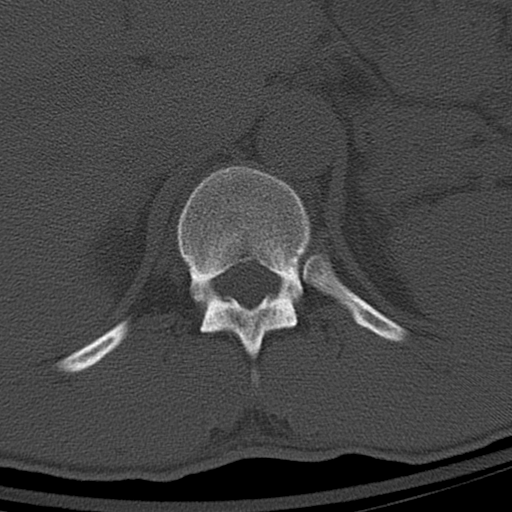
[im 43/94  bone]
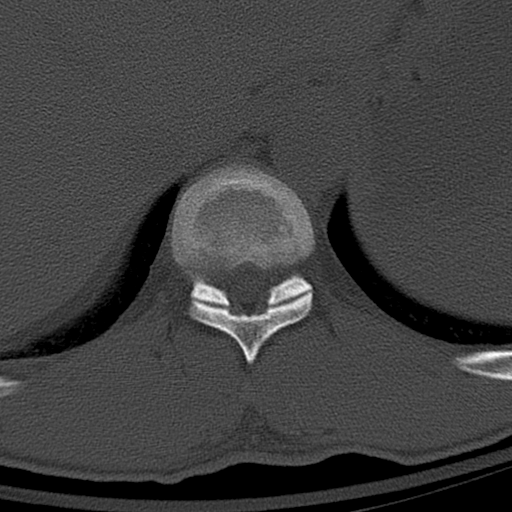
[im 51/94  bone]
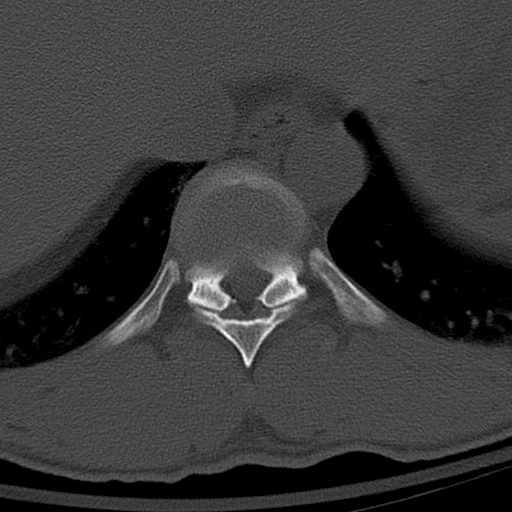
[im 58/94  bone]
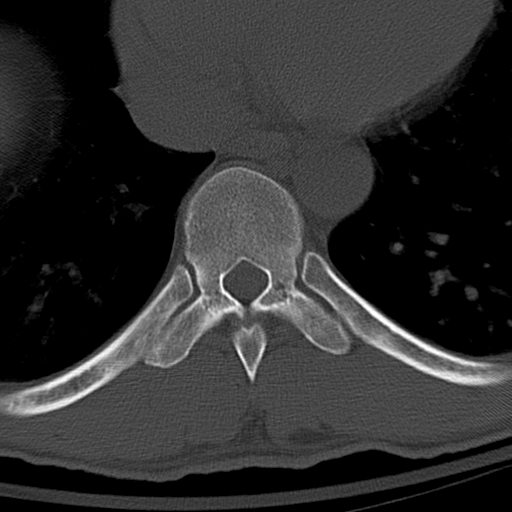
[im 65/94  soft-tissue]
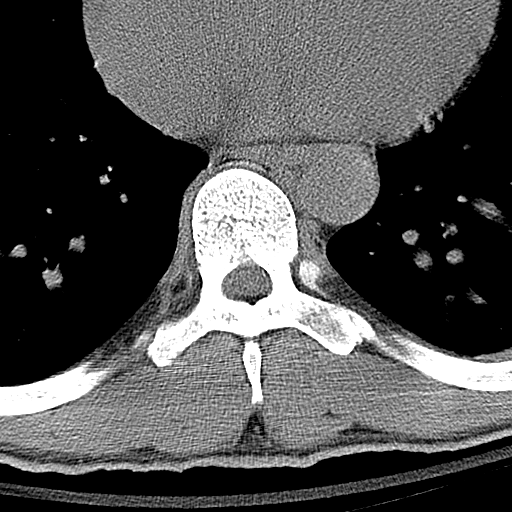
[im 65/94  bone]
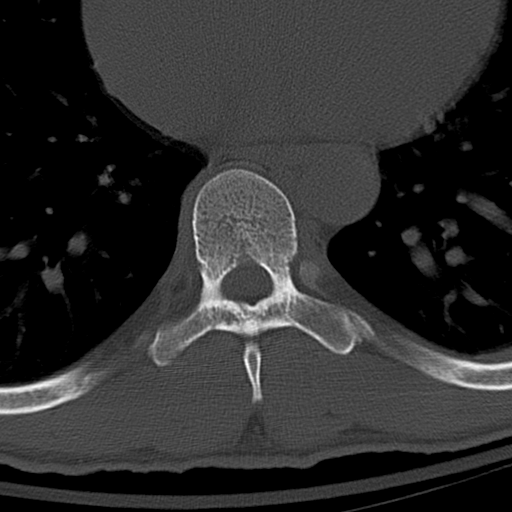
[im 72/94  bone]
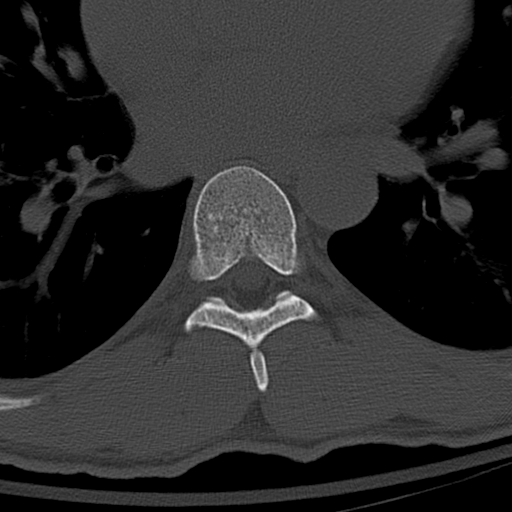
[im 79/94  bone]
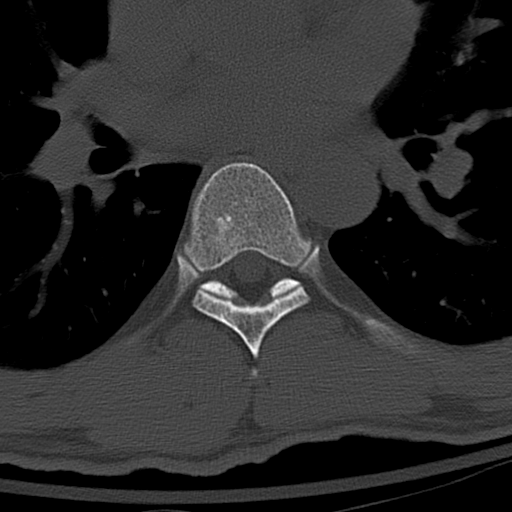
[im 86/94  bone]
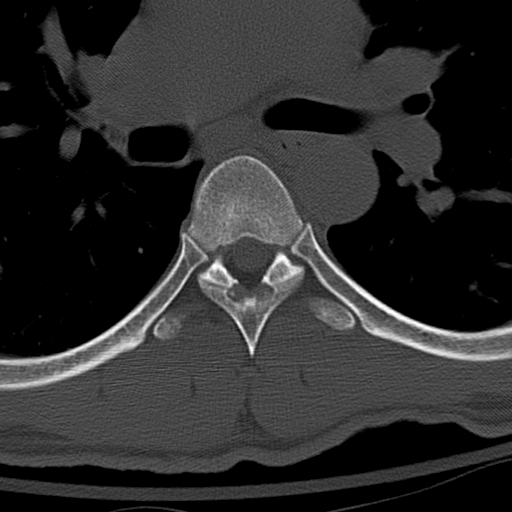

[12 of 14 positions shown; findings below may reference images not displayed]

PROCEDURE:     CT  - CT THORACIC SPINE WO  - May 30, 2013  [DATE]

RESULT:     Multislice helical acquisition is performed in the lower
thoracic spine and including to inferior L2 level and the inferior T4 level.
Axial, coronal and sagittal bone window reconstructions are performed. The
patient has no previous study for comparison.

The there is no evidence of vertebral compression fracture. The facets are
normally aligned with an intact appearance of the posterior elements. The
included ribs appear intact.
IMPRESSION: 1. No definite acute lumbar spine bony abnormality. MRI is available if
there is concern for traumatic disc herniation or other acute abnormality.

[REDACTED]

## 2013-11-25 ENCOUNTER — Emergency Department: Payer: Self-pay | Admitting: Emergency Medicine

## 2013-11-25 LAB — COMPREHENSIVE METABOLIC PANEL
ALT: 103 U/L — AB (ref 12–78)
ANION GAP: 7 (ref 7–16)
AST: 27 U/L (ref 15–37)
Albumin: 4.3 g/dL (ref 3.4–5.0)
Alkaline Phosphatase: 132 U/L — ABNORMAL HIGH
BILIRUBIN TOTAL: 1.1 mg/dL — AB (ref 0.2–1.0)
BUN: 24 mg/dL — AB (ref 7–18)
CHLORIDE: 93 mmol/L — AB (ref 98–107)
CREATININE: 1.28 mg/dL (ref 0.60–1.30)
Calcium, Total: 9.8 mg/dL (ref 8.5–10.1)
Co2: 28 mmol/L (ref 21–32)
EGFR (Non-African Amer.): >60
GLUCOSE: 516 mg/dL — AB (ref 65–99)
Osmolality: 284 (ref 275–301)
POTASSIUM: 4.5 mmol/L (ref 3.5–5.1)
Sodium: 128 mmol/L — ABNORMAL LOW (ref 136–145)
Total Protein: 8.3 g/dL — ABNORMAL HIGH (ref 6.4–8.2)

## 2013-11-25 LAB — URINALYSIS, COMPLETE
BILIRUBIN, UR: NEGATIVE
Bacteria: NONE SEEN
Blood: NEGATIVE
LEUKOCYTE ESTERASE: NEGATIVE
Nitrite: NEGATIVE
PH: 6 (ref 4.5–8.0)
Protein: NEGATIVE
RBC,UR: NONE SEEN /HPF (ref 0–5)
SPECIFIC GRAVITY: 1.028 (ref 1.003–1.030)
Squamous Epithelial: NONE SEEN
WBC UR: NONE SEEN /HPF (ref 0–5)

## 2013-11-25 LAB — CBC
HCT: 48.2 % (ref 40.0–52.0)
HGB: 15.4 g/dL (ref 13.0–18.0)
MCH: 30.5 pg (ref 26.0–34.0)
MCHC: 32 g/dL (ref 32.0–36.0)
MCV: 95 fL (ref 80–100)
PLATELETS: 178 10*3/uL (ref 150–440)
RBC: 5.06 10*6/uL (ref 4.40–5.90)
RDW: 13.4 % (ref 11.5–14.5)
WBC: 11.7 10*3/uL — ABNORMAL HIGH (ref 3.8–10.6)

## 2014-11-20 NOTE — Discharge Summary (Signed)
PATIENT NAME:  Christopher Bean, Christopher Bean MR#:  [id number] DATE OF BIRTH:  [birth year]  DATE OF ADMISSION:  02/01/2013 DATE OF DISCHARGE:  02/02/2013  ADMITTING DIAGNOSES: Elevated blood sugar, nausea, abdominal pain.   DISCHARGE DIAGNOSES: 1. Hyperglycemia, due to diabetic ketoacidosis.  2. Diabetic ketoacidosis due to patient unable to get his medication due to lack of continuity of care; he recently moved here.  3. Nausea, abdominal pain due to diabetic ketoacidosis, now improved.  4.  Acute renal failure due to dehydration, now resolved with IV hydration.  5. Hyponatremia, again due to pseudohyponatremia from hyperglycemia as well as dehydration; sodium has normalized.  6.  Leukocytosis, felt to be stress-mediated; his WBC count has trended downwards from  23 to 12.0.  CONSULTANTS:  Case management, to assist with home medications.   ADMITTING LABS: Glucose 525, BUN 25, creatinine 1.53, sodium 132, potassium 5.2, chloride 94. CO2 was 18, magnesium 1.7, hemoglobin A1c 8.8.   LFTs: Total protein 8.4, albumin 4.8, bili total 0.9, alkaline phosphatase was 137, AST 23, ALT 23. WBC 23.1, hemoglobin 15.3, platelet count was 212.   URINALYSIS: Nitrites negative, leukocytes negative.   WBC on July 6th was 12.0. His anion gap on the morning of July 6th was 8. Blood glucose was 80.   HOSPITAL COURSE: Please refer to H and P done by the admitting physician. The patient is a 34 year old type I diabetic who presented to the hospital with elevated blood sugar. The patient moved from [place] a few months ago and has not established a primary care physician. He ran out of his insulin 2 days ago and started noticing his blood sugars were very high. He was also having nausea and abdominal pain when he came to the ED. He was noted to have diabetic ketoacidosis. He was started on IV insulin, IV fluids with close electrolyte monitoring and his blood sugar checks. The patient remained stable throughout the night. His  anion gap resolved with the treatment.   He also had acute renal failure on presentation that resolved with IV hydration. The patient also had leukocytosis which was felt to be due to a stress reaction. At this time he is doing much better and is stable for discharge.   DISCHARGE ACTIVITY: As tolerated.   DISCHARGE FOLLOWUP: With either [facility] or [facility].   DISCHARGE MEDICATIONS: Metformin 1000 mg b.i.d., Lantus 30 units at bedtime, acetaminophen/hydrocodone 325/5, 1 tablet p.o. q.6 p.r.n. for pain.   Note: Thirty-two minutes spent on the discharge.     ____________________________ Lacie ScottsShreyang H. Allena KatzPatel, MD shp:dm D: 02/03/2013 08:18:23 ET T: 02/03/2013 08:27:06 ET JOB#: [id number]  cc: Donnalyn Juran H. Allena KatzPatel, MD, <Dictator> Charise CarwinSHREYANG H Shandrea Lusk MD ELECTRONICALLY SIGNED 02/11/2013 10:55

## 2014-11-20 NOTE — H&P (Signed)
PATIENT NAME:  Christopher Bean, Lloyd MR#:  [id number] DATE OF BIRTH:  1980/08/10  DATE OF ADMISSION:  02/01/2013  CHIEF COMPLAINT:  Nausea and elevated blood sugars.   HISTORY OF PRESENT ILLNESS:  This is a 34 year old male who presents to the hospital due to elevated blood sugars and persistent nausea, vomiting ongoing today. The patient moved from [place] a few months ago, has not established himself a primary care physician in this local area yet. He ran out of his insulin 2 days ago and his oral meds, too. He noticed his sugars were running high, therefore, called the ambulance, and he was brought to the ER. In the Emergency Room, the patient was noted to have significantly elevated blood sugars over 500 despite getting a L of IV fluids. He was noted to have an elevated anion gap and noted to be in diabetic ketoacidosis. Hospitalist services were contacted for further treatment and evaluation.   REVIEW OF SYSTEMS:  CONSTITUTIONAL:  No documented fever. No weight gain or weight loss.  EYES:  No blurry or double vision.  ENT:  No tinnitus. No postnasal drip. No redness of the oropharynx.  RESPIRATORY:  No cough, no wheeze, no hemoptysis, no dyspnea.  CARDIOVASCULAR:  No chest pain, no orthopnea, no palpitations, no syncope.  GASTROINTESTINAL:  Positive nausea. Positive vomiting. No diarrhea. No abdominal pain, no melena or hematochezia.  GENITOURINARY:  No dysuria, no hematuria.  ENDOCRINE:  No polyuria or nocturia. No heat or poor cold intolerance.  HEMATOLOGIC:  No anemia, no bruising, no bleeding.  INTEGUMENTARY:  No rashes. No lesions.  MUSCULOSKELETAL:  No arthritis. No swelling. No gout.  NEUROLOGIC:  No numbness or tingling. No ataxia. No seizure activity.  PSYCHIATRIC:  No anxiety, no insomnia. No ADD.   PAST MEDICAL HISTORY:  Consistent with type 1 diabetes, a history of diabetic ketoacidosis.   ALLERGIES:  NO KNOWN DRUG ALLERGIES.   SOCIAL HISTORY:  He does smoke about 2 to 3  cigarettes per day, has been smoking for the past 10 years. Occasional alcohol use. Does smoke marijuana occasionally, he smoked it yesterday. No other illicit drug abuse. Lives with his girlfriend.   FAMILY HISTORY:  The patient cannot recall anything about his father's family history. His mother does have thyroid problems.   CURRENT MEDICATIONS:  He is supposed to be on metformin 1000 mg twice daily and Lantus 30 units at bedtime.   PHYSICAL EXAMINATION ON ADMISSION IS AS FOLLOWS: VITAL SIGNS:  Temperature is 98.4, pulse 82, respirations 18, blood pressure 134/80, sats 99% on room air.  GENERAL:  The patient is a pleasant-appearing male, no apparent distress.  HEENT:  Atraumatic, normocephalic. Extraocular muscles are intact. Pupils equal, reactive to light. Sclerae anicteric. No conjunctival injection. No pharyngeal erythema.  NECK:  Supple. There is no jugular venous distention. No bruits, no lymphadenopathy or thyromegaly.  HEART:  Regular rhythm, tachycardic. No murmurs, rubs or clicks.  LUNGS:  Clear to auscultation bilaterally. No rales or rhonchi. No wheezes.  ABDOMEN:  Soft, flat, nontender, nondistended. Has good bowel sounds. No hepatosplenomegaly appreciated.  EXTREMITIES:  No evidence of any cyanosis, clubbing, or peripheral edema. Has +2 pedal and radial pulses bilaterally.  NEUROLOGICAL:  The patient is alert, awake, and oriented x 3 with no focal motor or sensory  deficits appreciated bilaterally.  SKIN:  Moist and warm with no rashes.  LYMPHATIC:  There is no cervical or axillary lymphadenopathy.   LABORATORY EXAM:  A serum glucose of 510, BUN 26,  creatinine 1.5, sodium 132, potassium 5.2, chloride 94, bicarb 18, anion gap of 20. White cell count is 23.1, hemoglobin 15.3, hematocrit 45.9, platelet count of 212. pH is 7.21, pCO2 of 41. This is venous gas.   ASSESSMENT AND PLAN:  This is a 34 year old male with a history of type 1 diabetes, a history of previous episodes of  diabetic ketoacidosis who ran out of his insulin and his oral meds two days ago, who presents to the hospital with nausea, vomiting and significant elevated blood sugars and noted to be in acute diabetic ketoacidosis.    PROBLEM LIST: 1.  Acute diabetic ketoacidosis. This is likely secondary to medical noncompliance. We will start the patient on aggressive IV fluids, place him on an insulin drip, DKA protocol, follow serial metabolic profile so the anion gap closes, follow electrolytes. We will replace his potassium accordingly. We will check a hemoglobin A1c. Place him on a clear liquid diet for now.  2.  Acute renal failure. This is likely secondary to dehydration and acute diabetic ketoacidosis. We will hydrate him with IV fluids, follow BUN and creatinine and urine output.  3.  Hyponatremia. This is likely pseudohyponatremia from hyperglycemia. It should improve as the sugars are corrected.  4.  Leukocytosis. This is likely stress-mediated from diabetic ketoacidosis. We will keep the patient off antibiotics for now and there is no evidence of acute infectious source.   CODE STATUS:  The patient is a FULL CODE.   CRITICAL CARE TIME SPENT:  50 minutes.  ____________________________ Rolly Pancake. Cherlynn Kaiser, MD vjs:jm D: 02/01/2013 16:14:10 ET T: 02/01/2013 17:28:12 ET JOB#: [id number]  cc: Rolly Pancake. Cherlynn Kaiser, MD, <Dictator> Houston Siren MD ELECTRONICALLY SIGNED 02/02/2013 16:45

## 2014-11-21 NOTE — Consult Note (Signed)
EGD showed esophagitis, mainly involving GE junction. No obvious M-W tear. No ulcers present. No active bleeding. No images captured. ADA diet ordered. Continue daily PPI. thanks  Electronic Signatures: Lutricia Feilh, Feliz Lincoln (MD)  (Signed on 15-Apr-15 13:26)  Authored  Last Updated: 15-Apr-15 13:26 by Lutricia Feilh, Briony Parveen (MD)

## 2014-11-21 NOTE — Consult Note (Signed)
PATIENT NAME:  Christopher Bean, Christopher Bean MR#:  [id number] DATE OF BIRTH:  03-02-1981  DATE OF CONSULTATION:  11/12/2013  PRIMARY CARE PHYSICIAN:  None CONSULTING PHYSICIAN:  [facility] Rodman Keyawn S. Brooks Stotz, NP, Dr. Lutricia FeilPaul Oh.  ATTENDING: Dr. Eliane DecreeS.  Patel.   REASON FOR CONSULTATION: Hematemesis.   HISTORY OF PRESENT ILLNESS: Christopher Bean is a 34 year old African American gentleman who is originally from [place] with significant medical history of diabetes mellitus type I diagnosed at the age of 34 and a history of suspected seizure secondary to history of diabetes. The patient has been able to remain on home medications and management for diabetes despite the fact that he has not seen the endocrinologist or a primary care doctor for the past year. The patient states his sugars usually range between 150 to 200 at home, although he was found to have a blood sugar very high in the 600s on presenting to the Emergency Room.   The patient states Monday evening at around 11:00 a.m. that he started to vomit a large amount of bright red blood. Approximately a cup in amount. Then, yesterday morning he vomited twice, with, he described it as just emesis with bright red blood being noted. He has not had any additional episodes of vomiting since he has been admitted. Nausea is much better controlled at this time with having received Zofran. Complaining of abdominal pain epigastric proximal to umbilical area. Denies radiating anywhere, describes as achy pain. Approximately an hour ago was a 9 on a scale of 1 to 10, currently a 5 after having received morphine. He does state that he has had similar symptoms to this in the past and has been told possibly ulcer disease, possibly colitis. Though he has never had endoscopic evaluation in the past.   Normal bowel pattern is every 3 to 4 days. Denies any evidence of melena. Weight does seem to go up and down recently. He has gained about 15 to 20 pounds. Denies any heartburn, reflux,  dysphagia.   PAST MEDICAL HISTORY: Diabetes mellitus, type I diagnosed at the age of 34 insulin-dependent, hypertension.   PAST SURGICAL HISTORY: Unremarkable.   FAMILY HISTORY: Hypertension, diabetes mellitus, congestive heart failure, thyroid conditions. Denies any history of neoplasm.   SOCIAL HISTORY: No tobacco use, denies any heavy use in the past, six cigarettes a day. Marijuana use on a daily basis.   HOME MEDICATIONS: Lantus 40 units at bedtime, NovoLog insulin 10 units after meals and metformin 1000 mg twice a day. Denies any NSAID use or aspirin use.   ALLERGIES: None.   REVIEW OF SYSTEMS:  CONSTITUTIONAL: No fevers. No fatigue, no weakness.  EYES: No blurred vision, double vision.  ENT: No tinnitus. No ear pain, hearing loss.  RESPIRATORY: No coughing, no wheezing.  CARDIOVASCULAR: No chest pain, heart palpitations.  GASTROINTESTINAL: See history of present illness.  GENITOURINARY: No dysuria or hematuria.  ENDOCRINE: No polyuria, no nocturia, no thyroid problems.  HEMATOLOGIC: Denies any easy bruising and bleeding.  SKIN: No lesions. No rashes.  NEUROLOGICAL: No history of CVA or TIA. 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  PSYCHIATRIC: No depression. No anxiety.   PHYSICAL EXAMINATION: VITAL SIGNS: Temperature 98.6 with a pulse of 76, respirations 18, blood pressure is 108/72 with a pulse oximetry of 98% on room air.  GENERAL: Well-developed, well-nourished, 34 year old African American gentleman, no acute distress noted. Appears to be resting comfortably in bed.  HEENT: Normocephalic, atraumatic. Pupils equal, reactive to light.  NECK: Supple. Trachea midline. No lymphadenopathy or thyromegaly.  PULMONARY: Symmetric rise and fall of chest. Clear to auscultation throughout.  CARDIOVASCULAR: Regular rhythm, S1, S2. No murmurs, no gallops.  ABDOMEN: Soft, nondistended. Bowel sounds in four quadrants. No bruits. No masses. No evidence of  hepatosplenomegaly.  RECTAL: Deferred.   MUSCULOSKELETAL: Moving all extremities. No contractures. No clubbing.  EXTREMITIES: No edema.  PSYCHIATRIC: Alert and oriented x 4. Memory grossly intact.  NEUROLOGICAL: No gross neurological deficits.   LABORATORY, DIAGNOSTIC, AND RADIOLOGICAL DATA: Chemistry panel on admission glucose 576 with creatinine 1.35, sodium 128, chloride is 94, anion gap is low at 6. Today glucose is improved to 288. Hemoglobin A1c though is 8.4. Hepatic panel within normal limits. CBC on admission within normal limits and serial trending of hemoglobin/hematocrit  every eight hours has remained within normal limits. There is no evidence of anemia, hemoglobin is remaining roughly in a range of 14.   IMPRESSION:  1. Hematemesis, abdominal pain with associated nausea and vomiting.  2. Type 2 diabetes. Uncontrolled.   PLAN: The patient's presentation was discussed with Dr. Lutricia Feil. Recommendation is to proceed forward with an upper endoscopy today to allow direct luminal evaluation to assess for evidence of ulcers or underlying source of blood loss. Procedure risks versus benefits discussed with patient. Order already placed by Dr. Bluford Kaufmann. We will continue to monitor. The patient continue on current medication therapy. inclusive PPI therapy at this time.   These services provided by Rodman Key, MS, APRN, [facility], FNP under collaborative agreement with Dr. Lutricia Feil. ____________________________ Rodman Key, NP dsh:sg D: 11/12/2013 12:40:54 ET T: 11/12/2013 13:16:48 ET JOB#: [id number]  cc: Rodman Key, NP, <Dictator> Rodman Key MD ELECTRONICALLY SIGNED 11/12/2013 16:49

## 2014-11-21 NOTE — Consult Note (Signed)
Brief Consult Note: Diagnosis: Hematemesis.  Associated nausea and vomiting.  Abdominal pain epigastric, mid abdomen.  Type I DM, uncontrolled.   Consult note dictated.   Orders entered.   Discussed with Attending MD.   Comments: Patient's presentation discussed with Dr. Lutricia FeilPaul Oh.  Recommendation to proceed forward with EGD to allow direct luminal evaluation of upper GI tract. Assess for evidence of ulcer disease, source of blood loss.  Procedure, risks, versus benefits discussed with patient.  Will continue to monitor.  Continue with PPI therapy.  Electronic Signatures: Rodman KeyHarrison, Mikenzie Mccannon S (NP)  (Signed 15-Apr-15 12:42)  Authored: Brief Consult Note   Last Updated: 15-Apr-15 12:42 by Rodman KeyHarrison, Juanantonio Stolar S (NP)

## 2014-11-21 NOTE — H&P (Signed)
PATIENT NAME:  Christopher Bean, Christopher Bean MR#:  [id number] DATE OF BIRTH:  30-Sep-1980  DATE OF ADMISSION:  11/11/2013  PRIMARY CARE PHYSICIAN: None.   CHIEF COMPLAINT: Vomiting blood since yesterday.   HISTORY OF PRESENT ILLNESS: Christopher Bean is a 34 year old gentleman originally from the [place] with history of diabetes and suspected seizures secondary to history of diabetes, comes to the Emergency Room after he started vomiting blood. The patient states he vomited "about a cupful" of bright red blood last night around 11:00 p.m. He did start having some epigastric abdominal pain. He went to bed, woke up this morning and had another episode of vomiting which was mainly streaks of blood along with some food particles. The third episode happened around 9:15 with streaks of blood. He comes to the Emergency Room and was found to have sugars very high in 600s. He does not appear to be in DKA. He received IV fluids and 20 units of insulin aspart. His sugars during my evaluation were down to 288. The patient is being admitted for further evaluation of his hematemesis. He is going to get a dose of IV Protonix. The patient denies drinking alcohol or any use of any NSAIDs.     PAST MEDICAL HISTORY:  Diabetes, on insulin, insulin-requiring.   FAMILY HISTORY: Positive for hypertension.   SOCIAL HISTORY:  Smokes about 6 cigarettes per day. Denies any other over-the-counter drug use.   ALLERGIES: No known drug allergies.   REVIEW OF SYSTEMS:    CONSTITUTIONAL: No fever, fatigue, weakness.  EYES: No blurred or double vision, glaucoma.  ENT: No tinnitus, ear pain, hearing loss or postnasal drip.  RESPIRATORY: No cough, wheeze, hemoptysis or COPD.  CARDIOVASCULAR: No chest pain, orthopnea, edema.  GASTROINTESTINAL: Positive for hematemesis and epigastric pain. Negative for rectal bleeding or constipation.  GENITOURINARY: No dysuria, hematuria or frequency.  ENDOCRINE: No polyuria, nocturia or thyroid problems.   HEMATOLOGY: No anemia, easy bruising or bleeding.  SKIN: No acne, rash.  NEURO:  No CVA or TIA.   PSYCHIATRIC: No anxiety or depression.  All other systems reviewed and negative.   PHYSICAL EXAMINATION: GENERAL: The patient is awake, alert, oriented x 3, not in acute distress.  VITAL SIGNS: Afebrile. Pulse is 80, blood pressure is 137/72. Pulse ox is 99% on room air.  HEENT: Atraumatic, normocephalic. PERRLA.  EOM intact. Oral mucosa is moist.  NECK: Supple. No JVD. No carotid bruit.  LUNGS: Clear to auscultation bilaterally. No rales, rhonchi, respiratory distress or labored breathing.  HEART: Both the heart sounds are normal. Rate, rhythm is regular. PMI not lateralized. Chest is nontender.  EXTREMITIES: Good pedal pulses, good femoral pulses. No lower extremity edema.  ABDOMEN: Soft and benign. There is some tenderness present in the epigastric area. No organomegaly. Positive bowel sounds.  NEUROLOGIC: Grossly intact cranial nerves II through XII. No motor or sensory deficit.  PSYCHIATRIC: The patient is awake, alert, oriented x 3.  SKIN: Warm and dry.   LABORATORY DATA: A CBC within normal limits. Basic metabolic panel within normal limits except glucose of 288. Anion gap is 4.   ASSESSMENT AND PLAN: A 34 year old Christopher Bean with history of diabetes, comes in with hematemesis and elevated blood sugar. He is being admitted with:  1.  Acute gastrointestinal bleed. He is being admitted with hematemesis, suspected due to almost like Mallory-Weiss tear. The patient had about a cupful of hematemesis yesterday and has slowed down. Will put patient on IV Zofran around the clock along with IV  proton pump inhibitor b.i.d.  I spoke with Dr. Mechele CollinElliott, who agrees with the above and recommends esophagogastroduodenoscopy tomorrow. We will keep the patient n.p.o. after midnight tonight. We will continue IV Protonix b.i.d. and IV fluids. Check hemoglobin in the morning.  2.  Uncontrolled diabetes.  The patient has been taking his insulin and metformin daily. We will resume back his insulin and metformin once gastroenterology procedure is completed.  I will give a very small dose of Lantus.  3.  Deep vein thrombosis prophylaxis. Will give thromboembolic disease stockings and sequential compression devices.   4.  Further workup according to the patient's clinical course.   Hospital admission plan was discussed with the patient, no family members present. The case was also discussed with Dr. Mechele CollinElliott.   TIME SPENT: 45 minutes.     ____________________________ Wylie HailSona A. Allena KatzPatel, MD sap:cs D: 11/11/2013 19:27:17 ET T: 11/11/2013 19:47:22 ET JOB#: [id number]  cc: Evangela Heffler A. Allena KatzPatel, MD, <Dictator> Willow OraSONA A Matalie Romberger MD ELECTRONICALLY SIGNED 11/24/2013 9:08

## 2014-11-21 NOTE — Discharge Summary (Signed)
PATIENT NAME:  Christopher Bean, Christopher Bean MR#:  [id number] DATE OF BIRTH:  [birth year]  DATE OF ADMISSION:  11/11/2013 DATE OF DISCHARGE:  11/12/2013  ADMITTING DIAGNOSIS: Hematemesis.   DISCHARGE DIAGNOSES: 1.  Hematemesis of unclear etiology at this time, status post esophagogastroduodenoscopy on 11/12/2013 by Dr. Bluford Kaufmannh, LA grade A reflux esophagitis noted.  2.  Mild dehydration related to poorly-controlled diabetes mellitus.  3.  Acute renal failure, resolved on IV fluids.  4.  Diabetes mellitus with hemoglobin A1c 8.4, insulin-dependent.  5.  Medical noncompliance.   DISCHARGE CONDITION: Stable.   DISCHARGE MEDICATIONS: The patient is to continue his outpatient medications which are:  1.  Metformin 1 gram twice daily.  2.  Lantus 40 units subcutaneously at bedtime.  3.  NovoLog mix 70/30 with 10 units 3 times daily with meals.  4.  Ranitidine 75 mg twice daily.  5.  Omeprazole if patient is able to afford, 40 mg twice daily.  6.  The patient is not to take Zofran.   DIET: Two grams salt, low fat, low cholesterol, carbohydrate-controlled diet, regular consistency.   ACTIVITY LIMITATIONS: As tolerated.    FOLLOWUP APPOINTMENTS: With [facility] in 2 days after discharge.   CONSULTANTS: Care management, social work, Ms. Owens Sharkawn Harrison, nurse practitioner for Dr. Bluford Kaufmannh, and Dr. Bluford Kaufmannh.    PROCEDURES: Upper GI endoscopy 15th of April 2015, by Dr. Bluford Kaufmannh revealing, as mentioned above, LA grade A reflux esophagitis. The examination was otherwise normal, normal stomach as well as normal examined duodenum.   HOSPITAL COURSE:  The patient is a 34 year old African American male with history of diabetes mellitus, also medical noncompliance, who presents to the hospital with complaints of vomiting blood since 13th of April 2015. Please refer to Dr. Jearl KlinefelterSona Patel's admission note on the 14th of April 2015. In the Emergency Room, he was noted to have renal insufficiency as well as mildly elevated blood glucose levels  of 600. He was not in DKA. He was given IV fluids as well as insulin IV.  His sugars improved. He was started on IV Protonix for his hematemesis and gastroenterology consultation was obtained. Physical exam was unremarkable. Gastroenterology felt that the patient would benefit from EGD which was performed on the 15th of April 2015. Since EGD was unremarkable, gastroenterology felt that the patient is to continue his medications as well as proton pump inhibitor, ranitidine.  The patient was given p.o. food and since he was able to tolerate his diet he is discharged home.   In regards to mild dehydration, as mentioned above he was rehydrated with IV fluids and his renal insufficiency resolved. For diabetes mellitus, the patient's hemoglobin A1c was rechecked and was found to be 8.4, which was better than it was in the past. The patient was advised to continue his NovoLog mix as well as insulin Lantus. He was advised to continue followup with primary care physician and again prescriptions were given for all of his medications.   The patient is being discharged home with the above-mentioned medications and followup. His vital signs on the day of discharge: Temperature was 98.1, pulse was 69, respiratory rate was 18, blood pressure 136/55, saturation was 99% on room air at rest.   TIME SPENT:  40 minutes.    ____________________________ Katharina Caperima Oaklyn Jakubek, MD rv:cs D: 11/12/2013 16:35:45 ET T: 11/12/2013 20:34:35 ET JOB#: [id number]  cc: Katharina Caperima Damien Batty, MD, <Dictator> [facility] Trenita Hulme MD ELECTRONICALLY SIGNED 11/29/2013 18:01

## 2014-11-21 NOTE — Consult Note (Signed)
Pt seen and examined. Please see Dawn Harrison's notes. Pt with epigastric pain with hematemesis since Mon PM. None today. Epig pain persists. No prior GI sxs. BG high on admission. Lot better today. No primary > 1210yr. Pt on PPI bid. Plan EGD this afternoon. Thanks.  Electronic Signatures: Lutricia Feilh, Sheron Tallman (MD)  (Signed on 15-Apr-15 12:48)  Authored  Last Updated: 15-Apr-15 12:48 by Lutricia Feilh, Evelette Hollern (MD)

## 2021-06-30 DEATH — deceased
